# Patient Record
Sex: Female | Born: 1945 | Race: White | Hispanic: No | Marital: Single | Smoking: Never smoker
Health system: Southern US, Community
[De-identification: ages and names within clinical notes are randomized; demographics above are authoritative.]

## PROBLEM LIST (undated history)

## (undated) DIAGNOSIS — N289 Disorder of kidney and ureter, unspecified: Secondary | ICD-10-CM

## (undated) DIAGNOSIS — I10 Essential (primary) hypertension: Secondary | ICD-10-CM

## (undated) DIAGNOSIS — M1990 Unspecified osteoarthritis, unspecified site: Secondary | ICD-10-CM

## (undated) HISTORY — PX: BARIATRIC SURGERY: SHX1103

## (undated) HISTORY — PX: TOTAL KNEE ARTHROPLASTY: SHX125

## (undated) HISTORY — PX: OTHER SURGICAL HISTORY: SHX169

---

## 2006-11-13 ENCOUNTER — Ambulatory Visit (HOSPITAL_COMMUNITY): Payer: Self-pay | Admitting: Psychiatry

## 2006-11-27 ENCOUNTER — Ambulatory Visit (HOSPITAL_COMMUNITY): Payer: Self-pay | Admitting: Psychiatry

## 2007-04-30 ENCOUNTER — Ambulatory Visit (HOSPITAL_COMMUNITY): Payer: Self-pay | Admitting: Psychiatry

## 2007-05-21 ENCOUNTER — Ambulatory Visit (HOSPITAL_COMMUNITY): Payer: Self-pay | Admitting: Psychology

## 2009-02-28 ENCOUNTER — Other Ambulatory Visit: Payer: Self-pay | Admitting: Emergency Medicine

## 2009-02-28 ENCOUNTER — Ambulatory Visit: Payer: Self-pay | Admitting: Infectious Diseases

## 2009-02-28 ENCOUNTER — Inpatient Hospital Stay (HOSPITAL_COMMUNITY): Admission: EM | Admit: 2009-02-28 | Discharge: 2009-03-16 | Payer: Self-pay | Admitting: Internal Medicine

## 2009-03-01 ENCOUNTER — Encounter (INDEPENDENT_AMBULATORY_CARE_PROVIDER_SITE_OTHER): Payer: Self-pay | Admitting: Internal Medicine

## 2009-03-09 DIAGNOSIS — L03818 Cellulitis of other sites: Secondary | ICD-10-CM

## 2009-03-09 DIAGNOSIS — L02818 Cutaneous abscess of other sites: Secondary | ICD-10-CM | POA: Insufficient documentation

## 2009-03-10 ENCOUNTER — Encounter (INDEPENDENT_AMBULATORY_CARE_PROVIDER_SITE_OTHER): Payer: Self-pay | Admitting: Internal Medicine

## 2009-03-18 ENCOUNTER — Encounter (INDEPENDENT_AMBULATORY_CARE_PROVIDER_SITE_OTHER): Payer: Self-pay | Admitting: *Deleted

## 2009-03-18 DIAGNOSIS — E559 Vitamin D deficiency, unspecified: Secondary | ICD-10-CM | POA: Insufficient documentation

## 2009-03-18 DIAGNOSIS — I10 Essential (primary) hypertension: Secondary | ICD-10-CM | POA: Insufficient documentation

## 2009-03-18 DIAGNOSIS — Z87448 Personal history of other diseases of urinary system: Secondary | ICD-10-CM | POA: Insufficient documentation

## 2009-03-18 DIAGNOSIS — E538 Deficiency of other specified B group vitamins: Secondary | ICD-10-CM | POA: Insufficient documentation

## 2009-03-18 DIAGNOSIS — J45909 Unspecified asthma, uncomplicated: Secondary | ICD-10-CM | POA: Insufficient documentation

## 2009-03-18 DIAGNOSIS — G4733 Obstructive sleep apnea (adult) (pediatric): Secondary | ICD-10-CM | POA: Insufficient documentation

## 2009-03-18 DIAGNOSIS — B029 Zoster without complications: Secondary | ICD-10-CM | POA: Insufficient documentation

## 2009-03-18 DIAGNOSIS — M1990 Unspecified osteoarthritis, unspecified site: Secondary | ICD-10-CM | POA: Insufficient documentation

## 2009-03-22 ENCOUNTER — Encounter: Payer: Self-pay | Admitting: Orthopedic Surgery

## 2009-03-22 ENCOUNTER — Encounter: Payer: Self-pay | Admitting: Infectious Diseases

## 2009-03-24 ENCOUNTER — Encounter: Payer: Self-pay | Admitting: Infectious Diseases

## 2009-03-24 ENCOUNTER — Ambulatory Visit: Payer: Self-pay | Admitting: Infectious Diseases

## 2009-04-03 ENCOUNTER — Ambulatory Visit: Payer: Self-pay | Admitting: Diagnostic Radiology

## 2009-04-03 ENCOUNTER — Emergency Department (HOSPITAL_BASED_OUTPATIENT_CLINIC_OR_DEPARTMENT_OTHER): Admission: EM | Admit: 2009-04-03 | Discharge: 2009-04-03 | Payer: Self-pay | Admitting: Emergency Medicine

## 2009-04-04 ENCOUNTER — Encounter: Payer: Self-pay | Admitting: Infectious Diseases

## 2009-04-07 ENCOUNTER — Telehealth: Payer: Self-pay | Admitting: Infectious Diseases

## 2009-04-15 DIAGNOSIS — L299 Pruritus, unspecified: Secondary | ICD-10-CM | POA: Insufficient documentation

## 2009-04-18 ENCOUNTER — Encounter: Payer: Self-pay | Admitting: Infectious Diseases

## 2010-05-02 NOTE — Miscellaneous (Signed)
Summary: [facility]: Orders  [facility]: Orders   Imported By: Florinda Marker 04/14/2009 15:11:50  _____________________________________________________________________  External Attachment:    Type:   Image     Comment:   External Document

## 2010-05-02 NOTE — Progress Notes (Signed)
Summary: Rash/intense itching after completing antibiotics in Dec.  Phone Note Call from Patient Call back at [facility] Phone [phone]   Caller: Patient Call For: Kari Sax, MD Reason for Call: Acute Illness Summary of Call: Seen in ED for rash/itching over the weekend.  Placed on steroids and told to take Benadryl for the itching.  Told that rash was probably a "delayed" reaction to the antibiotics she was on in December.  Pt. continues with "intense" itching and requesting assistance.  Please advise.  Jennet Maduro RN  April 07, 2009 12:15 PM   Follow-up for Phone Call        refer to derm?     Appended Document: Orders Update - Referral to Dermatology    Clinical Lists Changes  Problems: Added new problem of UNSPECIFIED PRURITIC DISORDER (ICD-698.9) Orders: Added new Referral order of Dermatology Referral (Derma) - Signed

## 2010-05-02 NOTE — Miscellaneous (Signed)
Summary: [facility] Health Cert[facility] Plan Of Care  [facility] Cert[facility] Plan Of Care   Imported By: Florinda Marker 04/20/2009 16:16:31  _____________________________________________________________________  External Attachment:    Type:   Image     Comment:   External Document

## 2010-06-18 LAB — CBC
HCT: 33.4 % — ABNORMAL LOW (ref 36.0–46.0)
Platelets: 320 10*3/uL (ref 150–400)
WBC: 9.9 10*3/uL (ref 4.0–10.5)

## 2010-06-18 LAB — COMPREHENSIVE METABOLIC PANEL
Albumin: 3.6 g/dL (ref 3.5–5.2)
CO2: 23 mEq/L (ref 19–32)
Chloride: 103 mEq/L (ref 96–112)
GFR calc Af Amer: 46 mL/min — ABNORMAL LOW (ref >60)
GFR calc non Af Amer: 38 mL/min — ABNORMAL LOW (ref >60)
Potassium: 4.2 mEq/L (ref 3.5–5.1)
Sodium: 138 mEq/L (ref 135–145)
Total Bilirubin: 0.6 mg/dL (ref 0.3–1.2)
Total Protein: 6.6 g/dL (ref 6.0–8.3)

## 2010-06-18 LAB — PROTIME-INR: INR: 1.06 (ref 0.00–1.49)

## 2010-06-18 LAB — DIFFERENTIAL
Basophils Absolute: 0.1 10*3/uL (ref 0.0–0.1)
Eosinophils Relative: 27 % — ABNORMAL HIGH (ref 0–5)
Lymphocytes Relative: 8 % — ABNORMAL LOW (ref 12–46)
Lymphs Abs: 0.8 10*3/uL (ref 0.7–4.0)
Monocytes Relative: 5 % (ref 3–12)
Neutrophils Relative %: 59 % (ref 43–77)

## 2010-06-18 LAB — ANTISTREPTOLYSIN O TITER: ASO: 191 IU/mL — ABNORMAL HIGH (ref 0–116)

## 2010-06-18 LAB — APTT: aPTT: 28 seconds (ref 24–37)

## 2010-07-04 LAB — CBC
HCT: 29.1 % — ABNORMAL LOW (ref 36.0–46.0)
HCT: 32.8 % — ABNORMAL LOW (ref 36.0–46.0)
Hemoglobin: 10.4 g/dL — ABNORMAL LOW (ref 12.0–15.0)
Hemoglobin: 11 g/dL — ABNORMAL LOW (ref 12.0–15.0)
Hemoglobin: 9.4 g/dL — ABNORMAL LOW (ref 12.0–15.0)
MCHC: 33.5 g/dL (ref 30.0–36.0)
MCV: 86.3 fL (ref 78.0–100.0)
Platelets: 467 10*3/uL — ABNORMAL HIGH (ref 150–400)
Platelets: 486 10*3/uL — ABNORMAL HIGH (ref 150–400)
RDW: 15.1 % (ref 11.5–15.5)
RDW: 15.6 % — ABNORMAL HIGH (ref 11.5–15.5)
RDW: 15.7 % — ABNORMAL HIGH (ref 11.5–15.5)
RDW: 16 % — ABNORMAL HIGH (ref 11.5–15.5)
RDW: 16.3 % — ABNORMAL HIGH (ref 11.5–15.5)
WBC: 11.8 10*3/uL — ABNORMAL HIGH (ref 4.0–10.5)
WBC: 8.9 10*3/uL (ref 4.0–10.5)

## 2010-07-04 LAB — DIFFERENTIAL
Basophils Absolute: 0 10*3/uL (ref 0.0–0.1)
Eosinophils Relative: 1 % (ref 0–5)
Lymphs Abs: 0.6 10*3/uL — ABNORMAL LOW (ref 0.7–4.0)
Monocytes Absolute: 0.8 10*3/uL (ref 0.1–1.0)
Neutro Abs: 10.2 10*3/uL — ABNORMAL HIGH (ref 1.7–7.7)

## 2010-07-04 LAB — BASIC METABOLIC PANEL
BUN: 10 mg/dL (ref 6–23)
BUN: 10 mg/dL (ref 6–23)
CO2: 23 mEq/L (ref 19–32)
Calcium: 8.7 mg/dL (ref 8.4–10.5)
Calcium: 8.8 mg/dL (ref 8.4–10.5)
Calcium: 9 mg/dL (ref 8.4–10.5)
Chloride: 100 mEq/L (ref 96–112)
Creatinine, Ser: 0.92 mg/dL (ref 0.4–1.2)
GFR calc Af Amer: >60 mL/min (ref >60)
GFR calc non Af Amer: 54 mL/min — ABNORMAL LOW (ref >60)
GFR calc non Af Amer: >60 mL/min (ref >60)
Glucose, Bld: 81 mg/dL (ref 70–99)
Glucose, Bld: 89 mg/dL (ref 70–99)
Glucose, Bld: 93 mg/dL (ref 70–99)
Potassium: 4.4 mEq/L (ref 3.5–5.1)
Potassium: 4.7 mEq/L (ref 3.5–5.1)
Sodium: 132 mEq/L — ABNORMAL LOW (ref 135–145)
Sodium: 139 mEq/L (ref 135–145)

## 2010-07-04 LAB — RENAL FUNCTION PANEL
Albumin: 2.1 g/dL — ABNORMAL LOW (ref 3.5–5.2)
BUN: 30 mg/dL — ABNORMAL HIGH (ref 6–23)
CO2: 26 mEq/L (ref 19–32)
CO2: 26 mEq/L (ref 19–32)
Calcium: 8.1 mg/dL — ABNORMAL LOW (ref 8.4–10.5)
Calcium: 8.5 mg/dL (ref 8.4–10.5)
Creatinine, Ser: 1.4 mg/dL — ABNORMAL HIGH (ref 0.4–1.2)
Creatinine, Ser: 2.74 mg/dL — ABNORMAL HIGH (ref 0.4–1.2)
GFR calc Af Amer: 21 mL/min — ABNORMAL LOW (ref >60)
GFR calc Af Amer: 46 mL/min — ABNORMAL LOW (ref >60)
GFR calc non Af Amer: 18 mL/min — ABNORMAL LOW (ref >60)
Glucose, Bld: 76 mg/dL (ref 70–99)
Phosphorus: 3 mg/dL (ref 2.3–4.6)
Sodium: 139 mEq/L (ref 135–145)

## 2010-07-04 LAB — COMPREHENSIVE METABOLIC PANEL
Albumin: 2 g/dL — ABNORMAL LOW (ref 3.5–5.2)
BUN: 78 mg/dL — ABNORMAL HIGH (ref 6–23)
Creatinine, Ser: 5.29 mg/dL — ABNORMAL HIGH (ref 0.4–1.2)
GFR calc Af Amer: 10 mL/min — ABNORMAL LOW (ref >60)
GFR calc non Af Amer: 8 mL/min — ABNORMAL LOW (ref >60)
Glucose, Bld: 80 mg/dL (ref 70–99)
Potassium: 4.2 mEq/L (ref 3.5–5.1)
Total Protein: 5.6 g/dL — ABNORMAL LOW (ref 6.0–8.3)

## 2010-07-04 LAB — GLUCOSE, CAPILLARY

## 2010-07-04 LAB — IRON AND TIBC

## 2010-07-04 LAB — CREATININE, SERUM
Creatinine, Ser: 0.95 mg/dL (ref 0.4–1.2)
GFR calc Af Amer: >60 mL/min (ref >60)
GFR calc non Af Amer: 59 mL/min — ABNORMAL LOW (ref >60)

## 2010-07-04 LAB — PHOSPHORUS: Phosphorus: 5.5 mg/dL — ABNORMAL HIGH (ref 2.3–4.6)

## 2010-07-04 LAB — WOUND CULTURE

## 2010-07-04 LAB — FERRITIN: Ferritin: 228 ng/mL (ref 10–291)

## 2010-07-05 LAB — CULTURE, BLOOD (ROUTINE X 2): Culture: NO GROWTH

## 2010-07-05 LAB — BASIC METABOLIC PANEL
BUN: 72 mg/dL — ABNORMAL HIGH (ref 6–23)
Calcium: 8.5 mg/dL (ref 8.4–10.5)
Creatinine, Ser: 5.8 mg/dL — ABNORMAL HIGH (ref 0.4–1.2)
GFR calc non Af Amer: 7 mL/min — ABNORMAL LOW (ref >60)
Glucose, Bld: 80 mg/dL (ref 70–99)

## 2010-07-05 LAB — URINE MICROSCOPIC-ADD ON

## 2010-07-05 LAB — CBC
Hemoglobin: 9.6 g/dL — ABNORMAL LOW (ref 12.0–15.0)
MCHC: 34.1 g/dL (ref 30.0–36.0)
MCV: 86.2 fL (ref 78.0–100.0)
Platelets: 366 10*3/uL (ref 150–400)
RBC: 3.27 MIL/uL — ABNORMAL LOW (ref 3.87–5.11)
RDW: 14.4 % (ref 11.5–15.5)
RDW: 15 % (ref 11.5–15.5)

## 2010-07-05 LAB — RENAL FUNCTION PANEL
BUN: 74 mg/dL — ABNORMAL HIGH (ref 6–23)
CO2: 21 mEq/L (ref 19–32)
Calcium: 7.6 mg/dL — ABNORMAL LOW (ref 8.4–10.5)
Creatinine, Ser: 6.41 mg/dL — ABNORMAL HIGH (ref 0.4–1.2)
Glucose, Bld: 97 mg/dL (ref 70–99)
Phosphorus: 7 mg/dL — ABNORMAL HIGH (ref 2.3–4.6)
Sodium: 135 mEq/L (ref 135–145)

## 2010-07-05 LAB — URINALYSIS, ROUTINE W REFLEX MICROSCOPIC
Ketones, ur: 15 mg/dL — AB
Nitrite: POSITIVE — AB
Specific Gravity, Urine: 1.028 (ref 1.005–1.030)
Urobilinogen, UA: 1 mg/dL (ref 0.0–1.0)
pH: 5 (ref 5.0–8.0)

## 2010-07-05 LAB — DIFFERENTIAL
Basophils Absolute: 0.2 10*3/uL — ABNORMAL HIGH (ref 0.0–0.1)
Eosinophils Absolute: 0.2 10*3/uL (ref 0.0–0.7)
Lymphocytes Relative: 3 % — ABNORMAL LOW (ref 12–46)
Monocytes Absolute: 1.4 10*3/uL — ABNORMAL HIGH (ref 0.1–1.0)
Neutrophils Relative %: 88 % — ABNORMAL HIGH (ref 43–77)

## 2010-07-05 LAB — VARICELLA-ZOSTER BY PCR: Varicella-Zoster, PCR: NOT DETECTED

## 2010-07-17 IMAGING — CR DG CHEST 2V
2 series · 2 of 2 positions shown · non-contrast
Comparison: 03/15/2009

CLINICAL DATA: Rash.  History shingles.  Question infection.

CHEST - 2 VIEW

[w chest pa]
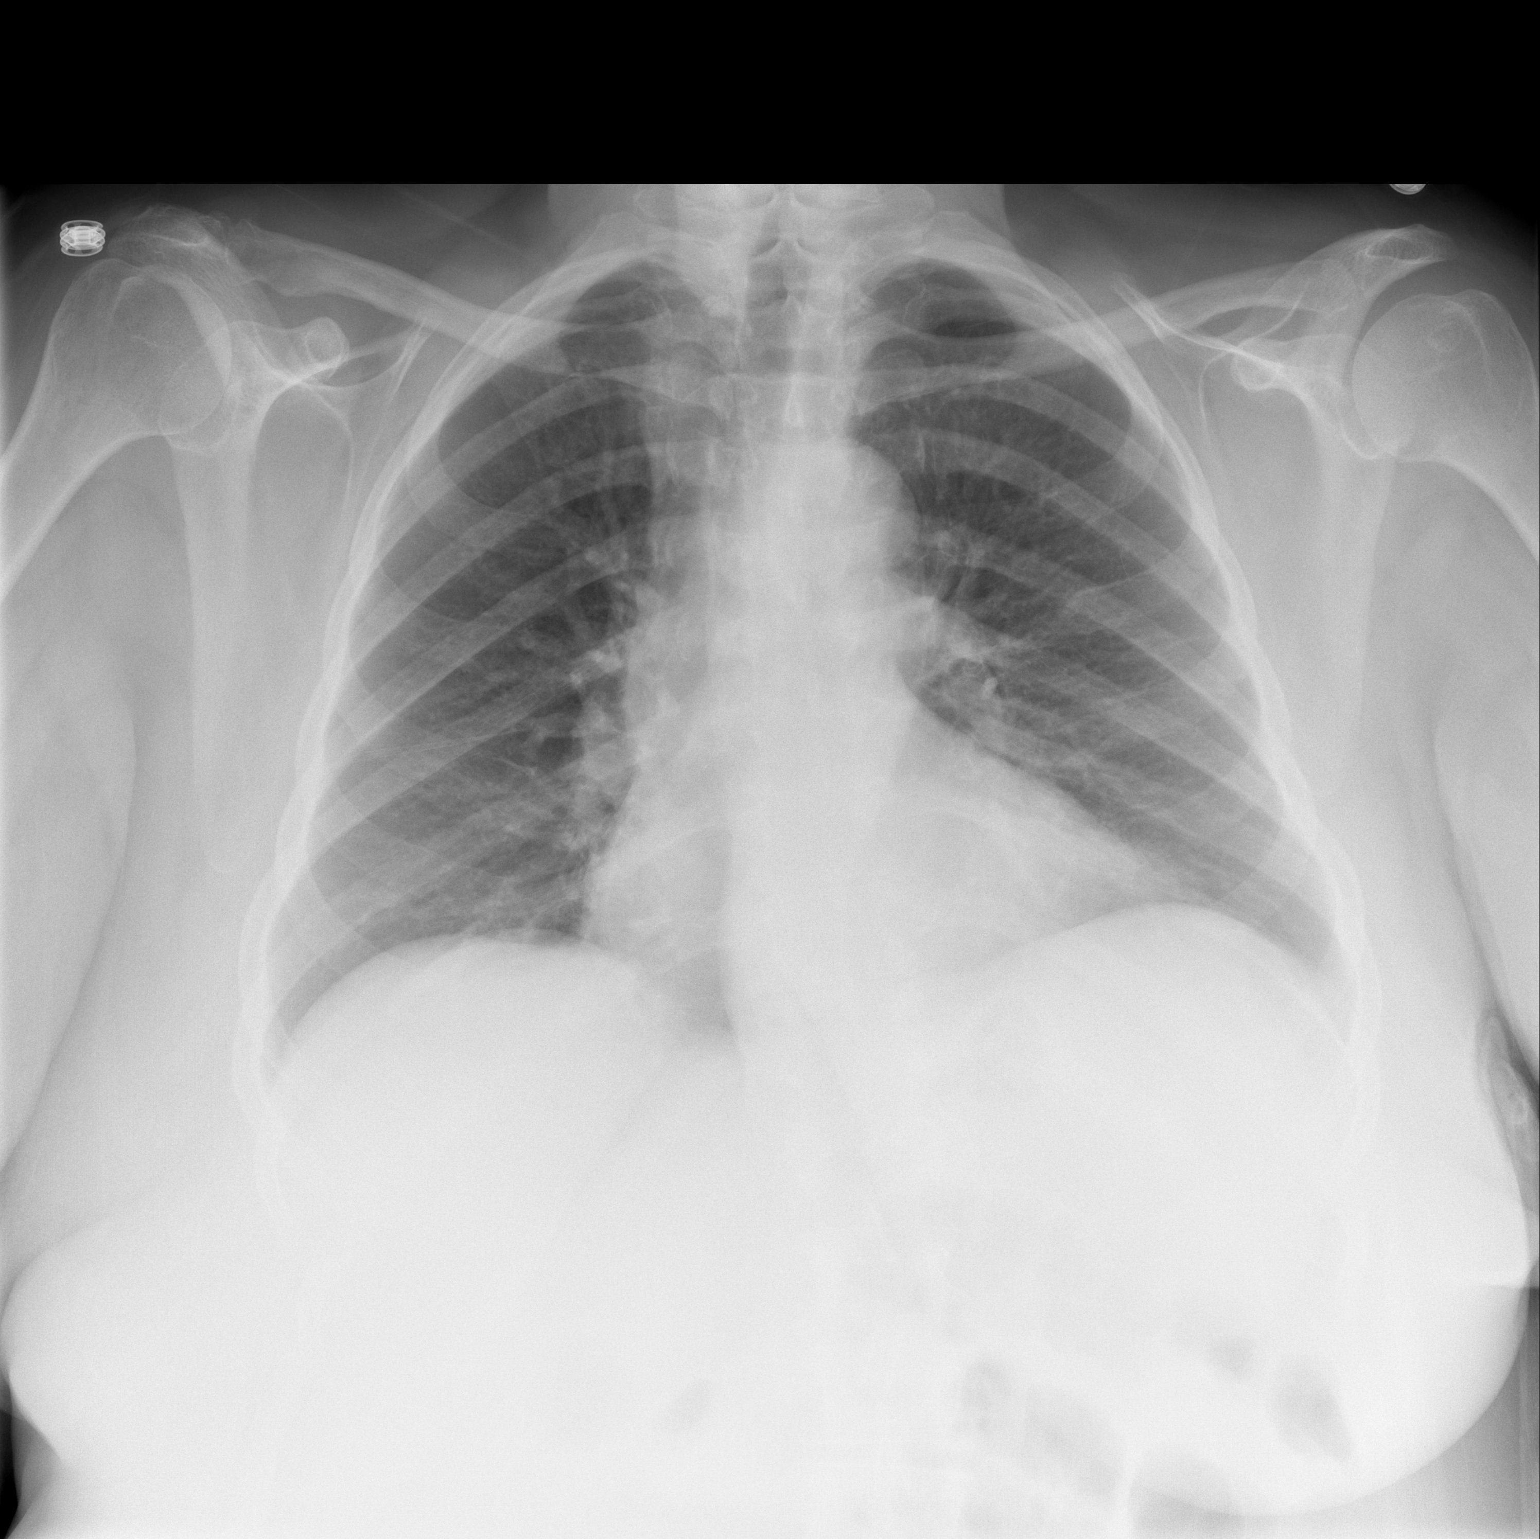

[w chest lat]
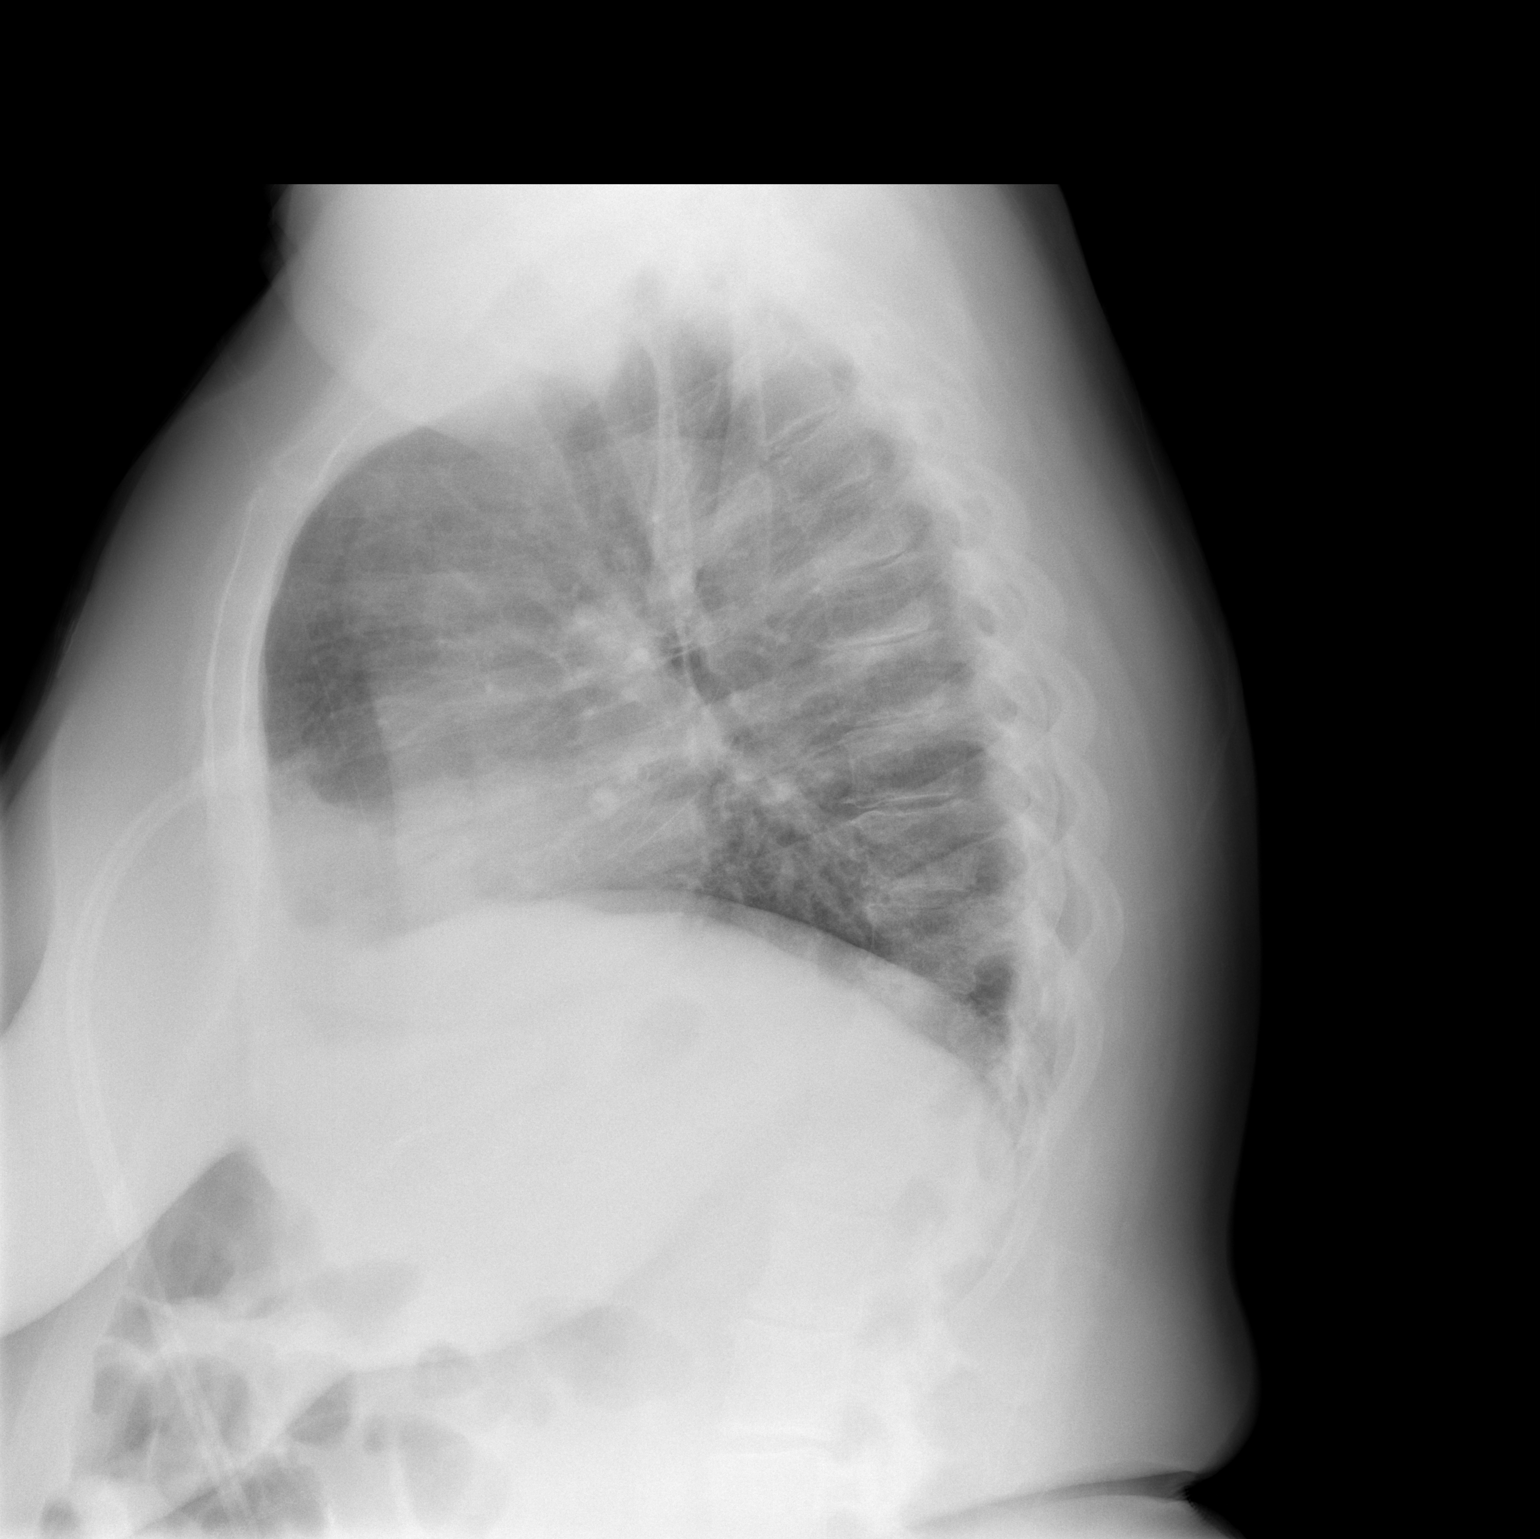

[2 of 2 positions shown; findings below may reference images not displayed]

FINDINGS: Midline trachea.  Normal heart size.  Removal of right-
sided PICC line. No pleural effusion or pneumothorax.  Low lung
volumes with resultant pulmonary interstitial prominence.  Minimal
bibasilar atelectasis.
IMPRESSION: Low lung volumes without acute disease.

## 2012-09-04 ENCOUNTER — Encounter (HOSPITAL_BASED_OUTPATIENT_CLINIC_OR_DEPARTMENT_OTHER): Payer: Self-pay | Admitting: *Deleted

## 2012-09-04 ENCOUNTER — Emergency Department (HOSPITAL_BASED_OUTPATIENT_CLINIC_OR_DEPARTMENT_OTHER): Payer: Medicare PPO

## 2012-09-04 ENCOUNTER — Emergency Department (HOSPITAL_BASED_OUTPATIENT_CLINIC_OR_DEPARTMENT_OTHER)
Admission: EM | Admit: 2012-09-04 | Discharge: 2012-09-04 | Disposition: A | Discharge status: Other Acute Inpatient Hospital | Payer: Medicare PPO | Attending: Emergency Medicine | Admitting: Emergency Medicine

## 2012-09-04 DIAGNOSIS — Z8739 Personal history of other diseases of the musculoskeletal system and connective tissue: Secondary | ICD-10-CM | POA: Insufficient documentation

## 2012-09-04 DIAGNOSIS — Z87448 Personal history of other diseases of urinary system: Secondary | ICD-10-CM | POA: Insufficient documentation

## 2012-09-04 DIAGNOSIS — I10 Essential (primary) hypertension: Secondary | ICD-10-CM | POA: Insufficient documentation

## 2012-09-04 DIAGNOSIS — K56609 Unspecified intestinal obstruction, unspecified as to partial versus complete obstruction: Secondary | ICD-10-CM | POA: Insufficient documentation

## 2012-09-04 DIAGNOSIS — Z9884 Bariatric surgery status: Secondary | ICD-10-CM | POA: Insufficient documentation

## 2012-09-04 DIAGNOSIS — Z880 Allergy status to penicillin: Secondary | ICD-10-CM | POA: Insufficient documentation

## 2012-09-04 DIAGNOSIS — R1084 Generalized abdominal pain: Secondary | ICD-10-CM | POA: Insufficient documentation

## 2012-09-04 HISTORY — DX: Unspecified osteoarthritis, unspecified site: M19.90

## 2012-09-04 HISTORY — DX: Disorder of kidney and ureter, unspecified: N28.9

## 2012-09-04 HISTORY — DX: Morbid (severe) obesity due to excess calories: E66.01

## 2012-09-04 HISTORY — DX: Essential (primary) hypertension: I10

## 2012-09-04 LAB — CBC WITH DIFFERENTIAL/PLATELET
Basophils Absolute: 0 10*3/uL (ref 0.0–0.1)
Eosinophils Absolute: 0.2 10*3/uL (ref 0.0–0.7)
Eosinophils Relative: 1 % (ref 0–5)
HCT: 46.5 % — ABNORMAL HIGH (ref 36.0–46.0)
Lymphocytes Relative: 10 % — ABNORMAL LOW (ref 12–46)
MCH: 28.6 pg (ref 26.0–34.0)
MCHC: 34 g/dL (ref 30.0–36.0)
MCV: 84.1 fL (ref 78.0–100.0)
Monocytes Absolute: 1.1 10*3/uL — ABNORMAL HIGH (ref 0.1–1.0)
Neutro Abs: 14.1 10*3/uL — ABNORMAL HIGH (ref 1.7–7.7)
Platelets: 460 10*3/uL — ABNORMAL HIGH (ref 150–400)
RDW: 14.6 % (ref 11.5–15.5)
WBC: 17.1 10*3/uL — ABNORMAL HIGH (ref 4.0–10.5)

## 2012-09-04 LAB — URINALYSIS, ROUTINE W REFLEX MICROSCOPIC
Glucose, UA: NEGATIVE mg/dL
Leukocytes, UA: NEGATIVE
pH: 5 (ref 5.0–8.0)

## 2012-09-04 LAB — COMPREHENSIVE METABOLIC PANEL
Albumin: 4.8 g/dL (ref 3.5–5.2)
Alkaline Phosphatase: 130 U/L — ABNORMAL HIGH (ref 39–117)
BUN: 21 mg/dL (ref 6–23)
Creatinine, Ser: 1.3 mg/dL — ABNORMAL HIGH (ref 0.50–1.10)
GFR calc Af Amer: 48 mL/min — ABNORMAL LOW (ref >90)
Glucose, Bld: 215 mg/dL — ABNORMAL HIGH (ref 70–99)
Potassium: 4.7 mEq/L (ref 3.5–5.1)
Total Bilirubin: 0.6 mg/dL (ref 0.3–1.2)
Total Protein: 9 g/dL — ABNORMAL HIGH (ref 6.0–8.3)

## 2012-09-04 LAB — URINE MICROSCOPIC-ADD ON

## 2012-09-04 MED ORDER — ONDANSETRON HCL 4 MG/2ML IJ SOLN
INTRAMUSCULAR | Status: AC
  Filled 2012-09-04: qty 2

## 2012-09-04 MED ORDER — FENTANYL CITRATE 0.05 MG/ML IJ SOLN
50.0000 ug | Freq: Once | INTRAMUSCULAR | Status: AC
  Administered 2012-09-04: 50 ug via INTRAVENOUS
  Filled 2012-09-04: qty 2

## 2012-09-04 MED ORDER — IOHEXOL 300 MG/ML  SOLN
50.0000 mL | Freq: Once | INTRAMUSCULAR | Status: AC | PRN
  Administered 2012-09-04: 50 mL via ORAL

## 2012-09-04 MED ORDER — PROMETHAZINE HCL 25 MG/ML IJ SOLN
12.5000 mg | Freq: Once | INTRAMUSCULAR | Status: AC
  Administered 2012-09-04: 12.5 mg via INTRAVENOUS

## 2012-09-04 MED ORDER — FENTANYL CITRATE 0.05 MG/ML IJ SOLN
100.0000 ug | Freq: Once | INTRAMUSCULAR | Status: AC
  Administered 2012-09-04: 100 ug via INTRAVENOUS
  Filled 2012-09-04: qty 2

## 2012-09-04 MED ORDER — SODIUM CHLORIDE 0.9 % IV BOLUS (SEPSIS)
1000.0000 mL | Freq: Once | INTRAVENOUS | Status: AC
  Administered 2012-09-04: 1000 mL via INTRAVENOUS

## 2012-09-04 MED ORDER — ONDANSETRON HCL 4 MG/2ML IJ SOLN
4.0000 mg | Freq: Once | INTRAMUSCULAR | Status: AC
  Administered 2012-09-04: 4 mg via INTRAVENOUS
  Filled 2012-09-04: qty 2

## 2012-09-04 MED ORDER — PROMETHAZINE HCL 25 MG/ML IJ SOLN
INTRAMUSCULAR | Status: AC
  Filled 2012-09-04: qty 1

## 2012-09-04 MED ORDER — ONDANSETRON HCL 4 MG/2ML IJ SOLN
4.0000 mg | Freq: Once | INTRAMUSCULAR | Status: AC
  Administered 2012-09-04: 4 mg via INTRAVENOUS

## 2012-09-04 NOTE — ED Notes (Signed)
MD at bedside. 

## 2012-09-04 NOTE — ED Notes (Signed)
Pt requesting additional nausea and pain medication. MD made aware. MD also made aware of pt being very drowsy and requiring oxygen to maintain oxygen saturation. No new orders received. Pt updated on POC.

## 2012-09-04 NOTE — ED Notes (Signed)
Pt stated that she ate chicken wings at a dinner at apprx. 6:00pm tonight and began vomiting at apprx. 8:30pm.

## 2012-09-04 NOTE — ED Notes (Signed)
Pt given ice chips

## 2012-09-04 NOTE — ED Notes (Signed)
Pt assisted to bedside commode. Pt unable to void. Pt assisted back to bed.

## 2012-09-04 NOTE — ED Provider Notes (Signed)
History     CSN: [id number]  Arrival date & time 09/04/12  0010   First MD Initiated Contact with Patient 09/04/12 [phone]      Chief Complaint  Patient presents with  . Emesis    (Consider location/radiation/quality/duration/timing/severity/associated sxs/prior treatment) Patient is a 67 y.o. female presenting with vomiting. The history is provided by the patient. No language interpreter was used.  Emesis Severity:  Severe Timing:  Intermittent Quality:  Stomach contents Progression:  Unchanged Chronicity:  New Relieved by:  Nothing Worsened by:  Nothing tried Ineffective treatments:  None tried Associated symptoms: abdominal pain   Associated symptoms: no diarrhea   Abdominal pain:    Location:  Generalized   Severity:  Severe   Onset quality:  Sudden   Timing:  Constant   Progression:  Unchanged   Chronicity:  New Risk factors: prior abdominal surgery   Patient is a 67 yo female with a PMH significant for bariatric surgery in the past.  Originally performed at an OSH in 1979 the procedure had to be reversed.  Later the patient reports having a stomach stapling procedure which was also reversed.  On Wednesday evening patient reports she was at church  At approximately 6 pm and ate some chicken wings which cool in temperature.  She reports at approximately 8:30 pm she developed emesis, had a stool but patient states this was not liquid in nature.  Presented to the ED after multiple episodes of emesis.    Past Medical History  Diagnosis Date  . Hypertension   . Arthritis   . Renal disorder     Past Surgical History  Procedure Laterality Date  . Bowel obstruction    . Bariatric surgery    . Total knee arthroplasty      No family history on file.  History  Substance Use Topics  . Smoking status: Never Smoker   . Smokeless tobacco: Not on file  . Alcohol Use: No    OB History   Grav Para Term Preterm Abortions TAB SAB Ect Mult Living                  Review  of Systems  Gastrointestinal: Positive for nausea, vomiting and abdominal pain. Negative for diarrhea.  All other systems reviewed and are negative.    Allergies  Codeine; Penicillins; Pentazocine lactate; and Valium  Home Medications  No current outpatient prescriptions on file.  BP 155/89  Pulse 104  Temp(Src) 98.3 F (36.8 C) (Oral)  Resp 20  Ht 5\' 1"  (1.549 m)  Wt 275 lb (124.739 kg)  BMI 51.99 kg/m2  SpO2 98%  Physical Exam  Constitutional: She is oriented to person, place, and time. She appears well-developed and well-nourished. No distress.  HENT:  Head: Normocephalic and atraumatic.  Mouth/Throat: Oropharynx is clear and moist.  Eyes: Conjunctivae are normal. Pupils are equal, round, and reactive to light.  Neck: Normal range of motion. Neck supple.  Cardiovascular: Normal rate and regular rhythm.   Pulmonary/Chest: Effort normal and breath sounds normal. Not tachypneic. No respiratory distress. She has no wheezes. She has no rales.  Abdominal: Soft. She exhibits no distension. Bowel sounds are increased. There is generalized tenderness. There is no rigidity, no rebound, no guarding, no tenderness at McBurney's point and negative Murphy's sign.  Musculoskeletal: Normal range of motion.  Neurological: She is alert and oriented to person, place, and time.  Skin: Skin is warm and dry.  Psychiatric: She has a normal mood and affect.  ED Course  Procedures (including critical care time)  Labs Reviewed  CBC WITH DIFFERENTIAL - Abnormal; Notable for the following:    WBC 17.1 (*)    RBC 5.53 (*)    Hemoglobin 15.8 (*)    HCT 46.5 (*)    Platelets 460 (*)    Neutrophils Relative % 83 (*)    Neutro Abs 14.1 (*)    Lymphocytes Relative 10 (*)    Monocytes Absolute 1.1 (*)    All other components within normal limits  COMPREHENSIVE METABOLIC PANEL - Abnormal; Notable for the following:    Glucose, Bld 215 (*)    Creatinine, Ser 1.30 (*)    Calcium 11.0 (*)     Total Protein 9.0 (*)    Alkaline Phosphatase 130 (*)    GFR calc non Af Amer 42 (*)    GFR calc Af Amer 48 (*)    All other components within normal limits  URINALYSIS, ROUTINE W REFLEX MICROSCOPIC   No results found.   No diagnosis found.   Patient informed of elevated blood sugar but states she does not have a h/o of diabetes.  She reports that it is elevated because she ate cheesecake this evening.  EDP instructed patient to follow up with her regular physician for further testing.  Patient verbalizes understanding and agrees to follow up   MDM    Results for orders placed during the hospital encounter of 09/04/12  CBC WITH DIFFERENTIAL      Result Value Range   WBC 17.1 (*) 4.0 - 10.5 K/uL   RBC 5.53 (*) 3.87 - 5.11 MIL/uL   Hemoglobin 15.8 (*) 12.0 - 15.0 g/dL   HCT 29.5 (*) 28.4 - 13.2 %   MCV 84.1  78.0 - 100.0 fL   MCH 28.6  26.0 - 34.0 pg   MCHC 34.0  30.0 - 36.0 g/dL   RDW 44.0  10.2 - 72.5 %   Platelets 460 (*) 150 - 400 K/uL   Neutrophils Relative % 83 (*) 43 - 77 %   Neutro Abs 14.1 (*) 1.7 - 7.7 K/uL   Lymphocytes Relative 10 (*) 12 - 46 %   Lymphs Abs 1.7  0.7 - 4.0 K/uL   Monocytes Relative 6  3 - 12 %   Monocytes Absolute 1.1 (*) 0.1 - 1.0 K/uL   Eosinophils Relative 1  0 - 5 %   Eosinophils Absolute 0.2  0.0 - 0.7 K/uL   Basophils Relative 0  0 - 1 %   Basophils Absolute 0.0  0.0 - 0.1 K/uL   WBC Morphology WHITE COUNT CONFIRMED ON SMEAR     Smear Review PLATELET COUNT CONFIRMED BY SMEAR    COMPREHENSIVE METABOLIC PANEL      Result Value Range   Sodium 136  135 - 145 mEq/L   Potassium 4.7  3.5 - 5.1 mEq/L   Chloride 97  96 - 112 mEq/L   CO2 20  19 - 32 mEq/L   Glucose, Bld 215 (*) 70 - 99 mg/dL   BUN 21  6 - 23 mg/dL   Creatinine, Ser 3.66 (*) 0.50 - 1.10 mg/dL   Calcium 44.0 (*) 8.4 - 10.5 mg/dL   Total Protein 9.0 (*) 6.0 - 8.3 g/dL   Albumin 4.8  3.5 - 5.2 g/dL   AST 20  0 - 37 U/L   ALT 11  0 - 35 U/L   Alkaline Phosphatase 130 (*) 39 -  117 U/L  Total Bilirubin 0.6  0.3 - 1.2 mg/dL   GFR calc non Af Amer 42 (*) >90 mL/min   GFR calc Af Amer 48 (*) >90 mL/min  URINALYSIS, ROUTINE W REFLEX MICROSCOPIC      Result Value Range   Color, Urine YELLOW  YELLOW   APPearance CLEAR  CLEAR   Specific Gravity, Urine 1.025  1.005 - 1.030   pH 5.0  5.0 - 8.0   Glucose, UA NEGATIVE  NEGATIVE mg/dL   Hgb urine dipstick NEGATIVE  NEGATIVE   Bilirubin Urine SMALL (*) NEGATIVE   Ketones, ur 15 (*) NEGATIVE mg/dL   Protein, ur 161 (*) NEGATIVE mg/dL   Urobilinogen, UA 0.2  0.0 - 1.0 mg/dL   Nitrite NEGATIVE  NEGATIVE   Leukocytes, UA NEGATIVE  NEGATIVE  URINE MICROSCOPIC-ADD ON      Result Value Range   Squamous Epithelial / LPF RARE  RARE   WBC, UA 0-2  <3 WBC/hpf   RBC / HPF 0-2  <3 RBC/hpf   Bacteria, UA RARE  RARE   Casts HYALINE CASTS (*) NEGATIVE  --Leukocytosis on CBC --Elevated blood glucose, normal anion gap --decreased GFR, will change IV contrast CT to PO contrast only to prevent renal toxicity in at patient with a h/o acute renal failure  ------------------------------------------------------------------------------------------------------------------------------------ Ct Abdomen Pelvis Wo Contrast  09/04/2012   *RADIOLOGY REPORT*  Clinical Data: Abdominal pain, nausea, vomiting.  CT ABDOMEN AND PELVIS WITHOUT CONTRAST  Technique:  Multidetector CT imaging of the abdomen and pelvis was performed following the standard protocol without intravenous contrast.  Comparison: Renal ultrasound performed 03/01/2009  Findings: Mild bibasilar atelectasis is noted.  Minimal hazy peripheral opacity at the left lung base likely also reflects atelectasis, though would assess for any symptoms of pneumonia.  The liver and spleen are unremarkable in appearance.  Stones are seen layering within the gallbladder; the gallbladder is relatively contracted and otherwise unremarkable.  The pancreas and adrenal glands are unremarkable.  There is mild  left renal atrophy.  A 2.4 cm cyst is noted along the interpole region of the right kidney.  The kidneys are otherwise unremarkable in appearance.  There is no evidence of hydronephrosis.  No renal or ureteral stones are seen.  No perinephric stranding is appreciated.  There is diffuse distension of small bowel loops to 4.6 cm in maximal diameter, with a transition point noted at the proximal edge of a small to moderate right periumbilical hernia, which contains a relatively short segment of mid to distal ileum.  Stool is noted within the herniated segment, and there is associated soft tissue stranding and a small amount of free fluid.  The more distal small bowel is decompressed.  Postoperative change is noted along the decompressed bowel loops.  The colon is also decompressed; postoperative change is seen at the hepatic flexure of the colon.  Findings are compatible with relatively high-grade small bowel obstruction due to the periumbilical hernia.  An additional bowel suture line is noted along the dilated more proximal small bowel.  There appears to have been reversal of prior gastric bypass surgery; the stomach is grossly unremarkable in appearance.  No acute vascular abnormalities are seen.  Minimal calcification is noted at the distal abdominal aorta and its branches.  A small anterior abdominal wall hernia is noted superior to the umbilicus, containing only fat.  The appendix is normal in caliber, without evidence for appendicitis.  The bladder is mildly distended and grossly unremarkable in appearance.  The uterus is  within normal limits.  The ovaries are relatively symmetric, aside from scattered calcifications at the right ovary.  No suspicious adnexal masses are seen.  No inguinal lymphadenopathy is seen.  No acute osseous abnormalities are identified.  Mild vacuum phenomenon is noted along the lower lumbar spine.  IMPRESSION:  1.  Relatively high-grade small bowel obstruction noted, due to herniation of  a short segment of mid to distal ileum into a small to moderate right periumbilical hernia.  Associated soft tissue inflammation and free fluid seen.  Stool is noted within the herniated segment; distal small and large bowel are decompressed. 2.  Visualized bowel suture lines along the stomach, small bowel and large bowel are unremarkable in appearance. 3.  Mild bibasilar atelectasis noted.  Minimal hazy peripheral opacity at the left lung base likely also reflects atelectasis, though would assess for any symptoms of pneumonia. 4.  Mild left renal atrophy; right renal cyst seen. 5.  Small anterior abdominal wall hernia noted superior to the umbilicus, containing only fat. 6.  Cholelithiasis; gallbladder relatively contracted and otherwise unremarkable in appearance.    ___________________________________________________________________________________________ Medications  sodium chloride 0.9 % bolus 1,000 mL (0 mLs Intravenous Stopped 09/04/12 0200)  ondansetron (ZOFRAN) injection 4 mg (4 mg Intravenous Given 09/04/12 0035)  promethazine (PHENERGAN) injection 12.5 mg (12.5 mg Intravenous Given 09/04/12 0102)  fentaNYL (SUBLIMAZE) injection 50 mcg (50 mcg Intravenous Given 09/04/12 0222)  ondansetron (ZOFRAN) injection 4 mg (4 mg Intravenous Given 09/04/12 0236)  iohexol (OMNIPAQUE) 300 MG/ML solution 50 mL (50 mLs Oral Contrast Given 09/04/12 0328)  ondansetron (ZOFRAN) injection 4 mg (4 mg Intravenous Given 09/04/12 0415)  fentaNYL (SUBLIMAZE) injection 100 mcg (100 mcg Intravenous Given 09/04/12 0415)   Patient requesting transfer to [facility], contacted Dr. Phineas Inches via access line who agreed to accept patient in transfer to the ED.  CD of films made.    MDM Reviewed: previous chart and vitals Reviewed previous: labs, x-ray and ultrasound Interpretation: labs Consults: general surgery (general surgery at [facility])      Shailyn Weyandt Smitty Cords, MD 09/04/12 4098

## 2012-09-04 NOTE — ED Notes (Signed)
Patient transported to CT 

## 2012-11-07 ENCOUNTER — Encounter (HOSPITAL_BASED_OUTPATIENT_CLINIC_OR_DEPARTMENT_OTHER): Payer: Self-pay | Admitting: Student

## 2012-11-07 ENCOUNTER — Emergency Department (HOSPITAL_BASED_OUTPATIENT_CLINIC_OR_DEPARTMENT_OTHER): Payer: Medicare PPO

## 2012-11-07 ENCOUNTER — Emergency Department (HOSPITAL_BASED_OUTPATIENT_CLINIC_OR_DEPARTMENT_OTHER)
Admission: EM | Admit: 2012-11-07 | Discharge: 2012-11-07 | Disposition: A | Discharge status: Home / Self Care | Payer: Medicare PPO | Attending: Emergency Medicine | Admitting: Emergency Medicine

## 2012-11-07 DIAGNOSIS — M7989 Other specified soft tissue disorders: Secondary | ICD-10-CM | POA: Insufficient documentation

## 2012-11-07 DIAGNOSIS — Z87448 Personal history of other diseases of urinary system: Secondary | ICD-10-CM | POA: Insufficient documentation

## 2012-11-07 DIAGNOSIS — Z880 Allergy status to penicillin: Secondary | ICD-10-CM | POA: Insufficient documentation

## 2012-11-07 DIAGNOSIS — Z8739 Personal history of other diseases of the musculoskeletal system and connective tissue: Secondary | ICD-10-CM | POA: Insufficient documentation

## 2012-11-07 DIAGNOSIS — R609 Edema, unspecified: Secondary | ICD-10-CM | POA: Insufficient documentation

## 2012-11-07 DIAGNOSIS — I10 Essential (primary) hypertension: Secondary | ICD-10-CM | POA: Insufficient documentation

## 2012-11-07 NOTE — ED Provider Notes (Signed)
CSN: [id number]     Arrival date & time 11/07/12  1121 History     First MD Initiated Contact with Patient 11/07/12 1131     Chief Complaint  Patient presents with  . Foot Pain    right foot   (Consider location/radiation/quality/duration/timing/severity/associated sxs/prior Treatment) Patient is a 67 y.o. female presenting with lower extremity pain.  Foot Pain   Pt reports R foot pain and swelling off and on for the last several days. No known injury, swelling is mainly to top of foot and ankle, comes and goes, worse with walking and at night. Recently had hernia repair surgery about 2 months ago but has been doing well since. She has history of L sided Baker's cyst in the past, denies knee pain or calf pain/swelling today. She reports she felt a twinge of pain in her R knee while walking about 10 days ago but otherwise no other symptoms. Denies fever, CP, SOB. Reports she had a prior occult foot fracture found on subsequent xrays. Also concerned about DVT give recent surgery (she is retired Engineer, civil (consulting)). She has been treating with elevation and ice which has helped.  Past Medical History  Diagnosis Date  . Hypertension   . Arthritis   . Renal disorder   . Morbid obesity    Past Surgical History  Procedure Laterality Date  . Bowel obstruction    . Bariatric surgery    . Total knee arthroplasty     History reviewed. No pertinent family history. History  Substance Use Topics  . Smoking status: Never Smoker   . Smokeless tobacco: Not on file  . Alcohol Use: No   OB History   Grav Para Term Preterm Abortions TAB SAB Ect Mult Living                 Review of Systems All other systems reviewed and are negative except as noted in HPI.   Allergies  Codeine; Penicillins; Pentazocine lactate; and Valium  Home Medications  No current outpatient prescriptions on file. BP 185/78  Pulse 90  Temp(Src) 98.8 F (37.1 C) (Oral)  Resp 20  SpO2 95% Physical Exam  Nursing note and  vitals reviewed. Constitutional: She is oriented to person, place, and time. She appears well-developed and well-nourished.  HENT:  Head: Normocephalic and atraumatic.  Eyes: EOM are normal. Pupils are equal, round, and reactive to light.  Neck: Normal range of motion. Neck supple.  Cardiovascular: Normal rate, normal heart sounds and intact distal pulses.   Pulmonary/Chest: Effort normal and breath sounds normal.  Abdominal: Bowel sounds are normal. She exhibits no distension. There is no tenderness.  Musculoskeletal: Normal range of motion. She exhibits edema (mild edema of the R foot compared to L, no calf or knee tenderness, no redness, warmth or induration. ). She exhibits no tenderness.  Neurological: She is alert and oriented to person, place, and time. She has normal strength. No cranial nerve deficit or sensory deficit.  Skin: Skin is warm and dry. No rash noted.  Psychiatric: She has a normal mood and affect.    ED Course   Procedures (including critical care time)  Labs Reviewed - No data to display US Venous Img Lower Unilateral Right  11/07/2012   *RADIOLOGY REPORT*  Clinical Data: Right foot pain and swelling  RIGHT LOWER EXTREMITY VENOUS DUPLEX ULTRASOUND  Technique: Gray-scale sonography with graded compression, as well as color Doppler and duplex ultrasound, were performed to evaluate the deep venous system of the right  lower extremity from the level of the common femoral vein through the popliteal and proximal calf veins. Spectral Doppler was utilized to evaluate flow at rest and with distal augmentation maneuvers.  Comparison: No comparison studies available.  Findings: The right common femoral vein, superficial femoral vein, profunda femoral vein, saphenofemoral junction, and popliteal vein show normal patent directional flow, normal phasicity, normal augmentation, and normal compression.  Posterior tibial and peroneal veins appear patent where visualized below the knee.   IMPRESSION: No evidence for DVT in the right lower extremity.   Original Report Authenticated By: Kennith Center, M.D.   Dg Foot Complete Right  11/07/2012   *RADIOLOGY REPORT*  Clinical Data: Right foot pain and swelling.  RIGHT FOOT COMPLETE - 3+ VIEW  Comparison: None.  Findings: There is no evidence for an acute fracture.  Bony alignment is anatomic. No worrisome lytic or sclerotic osseous lesion.  Mild degenerative changes are seen in the MTP joint of the great toe.  IMPRESSION: No acute bony findings.   Original Report Authenticated By: Kennith Center, M.D.   1. Foot swelling     MDM  Xray and [place] neg. No concern for CHF or cellulitis. Advised ACE wrap for comfort and PCP followup for further eval.   Mieko Kneebone B. Bernette Mayers, MD 11/07/12 1302

## 2012-11-07 NOTE — ED Notes (Signed)
Pt in with c/o right foot swelling x 1 week but denies any mechanical source of injury. Reports increased swelling at night with occasional pitting edema. Reports pain with increased ambulation.

## 2019-04-30 ENCOUNTER — Ambulatory Visit: Payer: Self-pay

## 2019-05-05 ENCOUNTER — Ambulatory Visit: Payer: Self-pay

## 2019-05-08 ENCOUNTER — Ambulatory Visit: Payer: Medicare PPO | Attending: Internal Medicine

## 2019-05-08 DIAGNOSIS — Z23 Encounter for immunization: Secondary | ICD-10-CM

## 2019-05-08 NOTE — Progress Notes (Signed)
   Covid-19 Vaccination Clinic  Name:  Kari French    MRN: [id number] DOB: [birth year]  05/08/2019  Kari French was observed post Covid-19 immunization for 15 minutes without incidence. She was provided with Vaccine Information Sheet and instruction to access the V-Safe system.   Kari French was instructed to call 911 with any severe reactions post vaccine: Marland Kitchen Difficulty breathing  . Swelling of your face and throat  . A fast heartbeat  . A bad rash all over your body  . Dizziness and weakness    Immunizations Administered    Name Date Dose VIS Date Route   Pfizer COVID-19 Vaccine 05/08/2019  4:08 PM 0.3 mL 03/13/2019 Intramuscular   Manufacturer: [employer], [employer]   Lot: [id number]   NDC: [address]-9

## 2019-06-03 ENCOUNTER — Ambulatory Visit: Payer: Medicare PPO | Attending: Internal Medicine

## 2019-06-03 DIAGNOSIS — Z23 Encounter for immunization: Secondary | ICD-10-CM

## 2019-06-03 NOTE — Progress Notes (Signed)
   Covid-19 Vaccination Clinic  Name:  Kari French    MRN: [id number] DOB: Feb 04, 1946  06/03/2019  [name] was observed post Covid-19 immunization for 15 minutes without incident. She was provided with Vaccine Information Sheet and instruction to access the V-Safe system.   [name] was instructed to call 911 with any severe reactions post vaccine: Marland Kitchen Difficulty breathing  . Swelling of face and throat  . A fast heartbeat  . A bad rash all over body  . Dizziness and weakness   Immunizations Administered    Name Date Dose VIS Date Route   Pfizer COVID-19 Vaccine 06/03/2019 10:14 AM 0.3 mL 03/13/2019 Intramuscular   Manufacturer: [employer], [employer]   Lot: [id number]   NDC: [address]-2
